# Patient Record
Sex: Male | Born: 1995 | Hispanic: Yes | Marital: Single | Smoking: Never smoker
Health system: Southern US, Community
[De-identification: ages and names within clinical notes are randomized; demographics above are authoritative.]

## PROBLEM LIST (undated history)

## (undated) DIAGNOSIS — Z6831 Body mass index (BMI) 31.0-31.9, adult: Secondary | ICD-10-CM

## (undated) HISTORY — DX: Body mass index (BMI) 31.0-31.9, adult: Z68.31

---

## 2016-03-29 ENCOUNTER — Encounter: Payer: Self-pay | Admitting: Emergency Medicine

## 2016-03-29 ENCOUNTER — Emergency Department
Admission: EM | Admit: 2016-03-29 | Discharge: 2016-03-29 | Disposition: A | Discharge status: Home / Self Care | Payer: BLUE CROSS/BLUE SHIELD | Attending: Student | Admitting: Student

## 2016-03-29 DIAGNOSIS — S61411A Laceration without foreign body of right hand, initial encounter: Secondary | ICD-10-CM | POA: Insufficient documentation

## 2016-03-29 DIAGNOSIS — Y939 Activity, unspecified: Secondary | ICD-10-CM | POA: Diagnosis not present

## 2016-03-29 DIAGNOSIS — W268XXA Contact with other sharp object(s), not elsewhere classified, initial encounter: Secondary | ICD-10-CM | POA: Diagnosis not present

## 2016-03-29 DIAGNOSIS — Y999 Unspecified external cause status: Secondary | ICD-10-CM | POA: Diagnosis not present

## 2016-03-29 DIAGNOSIS — Y929 Unspecified place or not applicable: Secondary | ICD-10-CM | POA: Diagnosis not present

## 2016-03-29 MED ORDER — TETANUS-DIPHTH-ACELL PERTUSSIS 5-2.5-18.5 LF-MCG/0.5 IM SUSP
0.5000 mL | Freq: Once | INTRAMUSCULAR | Status: AC
  Administered 2016-03-29: 0.5 mL via INTRAMUSCULAR
  Filled 2016-03-29: qty 0.5

## 2016-03-29 MED ORDER — TRAMADOL HCL 50 MG PO TABS
50.0000 mg | ORAL_TABLET | Freq: Four times a day (QID) | ORAL | Status: AC | PRN
Start: 2016-03-29 — End: 2017-03-29

## 2016-03-29 MED ORDER — LIDOCAINE HCL (PF) 1 % IJ SOLN
INTRAMUSCULAR | Status: AC
  Filled 2016-03-29: qty 5

## 2016-03-29 MED ORDER — TRAMADOL HCL 50 MG PO TABS
50.0000 mg | ORAL_TABLET | Freq: Once | ORAL | Status: AC
  Administered 2016-03-29: 50 mg via ORAL
  Filled 2016-03-29: qty 1

## 2016-03-29 MED ORDER — SULFAMETHOXAZOLE-TRIMETHOPRIM 800-160 MG PO TABS: 1.0000 | ORAL_TABLET | Freq: Two times a day (BID) | ORAL | Status: DC

## 2016-03-29 NOTE — ED Provider Notes (Signed)
[facility] Emergency Department Provider Note   ____________________________________________  Time seen: Approximately 6:29 PM  I have reviewed the triage vital signs and the nursing notes.   HISTORY  Chief Complaint Laceration    HPI Jesse Jimenez. is a 20 y.o. male patient is a laceration to back her right hand at the third and fourth metacarpal head. Patient is a metal cut from a goal posteach occurred prior to arrival. Patient state bleeding is controlled only with direct pressure. Patient denies any loss sensation or loss of function. Patient is right-hand dominant. No other palliative measures for this complaint. She denies pain at this time.   History reviewed. No pertinent past medical history.  There are no active problems to display for this patient.   History reviewed. No pertinent past surgical history.  Current Outpatient Rx  Name  Route  Sig  Dispense  Refill  . sulfamethoxazole-trimethoprim (BACTRIM DS,SEPTRA DS) 800-160 MG tablet   Oral   Take 1 tablet by mouth 2 (two) times daily.   20 tablet   0   . traMADol (ULTRAM) 50 MG tablet   Oral   Take 1 tablet (50 mg total) by mouth every 6 (six) hours as needed.   20 tablet   0     Allergies Review of patient's allergies indicates no known allergies.  History reviewed. No pertinent family history.  Social History Social History  Substance Use Topics  . Smoking status: Never Smoker   . Smokeless tobacco: None  . Alcohol Use: No    Review of Systems Constitutional: No fever/chills Eyes: No visual changes. ENT: No sore throat. Cardiovascular: Denies chest pain. Respiratory: Denies shortness of breath. Gastrointestinal: No abdominal pain.  No nausea, no vomiting.  No diarrhea.  No constipation. Genitourinary: Negative for dysuria. Musculoskeletal: Negative for back pain. Skin: Negative for rash. Laceration back right hand Neurological: Negative for headaches,  focal weakness or numbness.  .  ____________________________________________   PHYSICAL EXAM:  VITAL SIGNS: ED Triage Vitals  Enc Vitals Group     BP --      Pulse --      Resp --      Temp --      Temp src --      SpO2 --      Weight --      Height --      Head Cir --      Peak Flow --      Pain Score 03/29/16 1706 0     Pain Loc --      Pain Edu? --      Excl. in GC? --     Constitutional: Alert and oriented. Well appearing and in no acute distress. Eyes: Conjunctivae are normal. PERRL. EOMI. Head: Atraumatic. Nose: No congestion/rhinnorhea. Mouth/Throat: Mucous membranes are moist.  Oropharynx non-erythematous. Neck: No stridor. No cervical spine tenderness to palpation. Hematological/Lymphatic/Immunilogical: No cervical lymphadenopathy. Cardiovascular: Normal rate, regular rhythm. Grossly normal heart sounds.  Good peripheral circulation. Respiratory: Normal respiratory effort.  No retractions. Lungs CTAB. Gastrointestinal: Soft and nontender. No distention. No abdominal bruits. No CVA tenderness. Musculoskeletal: No lower extremity tenderness nor edema.  No joint effusions. Neurologic:  Normal speech and language. No gross focal neurologic deficits are appreciated. No gait instability. Skin:  Skin is warm, dry and intact. No rash noted.0.5 cm laceration at both the third and fourth metacarpal head. Psychiatric: Mood and affect are normal. Speech and behavior are normal.  ____________________________________________  LABS (all labs ordered are listed, but only abnormal results are displayed)  Labs Reviewed - No data to display ____________________________________________  EKG   ____________________________________________  RADIOLOGY   ____________________________________________   PROCEDURES  Procedure(s) performed: LACERATION REPAIR Performed by: Joni Reiningonald K Lucindia Lemley Authorized by: Joni Reiningonald K Jemila Camille Consent: Verbal consent obtained. Risks and benefits:  risks, benefits and alternatives were discussed Consent given by: patient Patient identity confirmed: provided demographic data Prepped and Draped in normal sterile fashion Wound explored  Laceration Location: Right third and fourth metacarpal head.  Laceration Length: Both laceration 0.5 cm  No Foreign Bodies seen or palpated  Anesthesia: local infiltration  Local anesthetic: lidocaine 1% without epinephrine  Anesthetic total: 4 ml  Irrigation method: syringe Amount of cleaning: standard  Skin closure: 4-0 nylon   Number of sutures: 4 sutures third metacarpal and 3 sutures fourth metacarpal   Technique: Simple   Patient tolerance: Patient tolerated the procedure well with no immediate complications.   Procedures  Critical Care performed: No  ____________________________________________   INITIAL IMPRESSION / ASSESSMENT AND PLAN / ED COURSE  Pertinent labs & imaging results that were available during my care of the patient were reviewed by me and considered in my medical decision making (see chart for details).  Right hand laceration involving the third and fourth metacarpal head. Patient given discharge care instruction. Patient given a prescription for tramadol and Bactrim DS. Patient given a tetanus shot in the ED before discharge. Patient advised return back 10 days for suture removal. Patient also has sutures removed by family urgent care clinic. ____________________________________________   FINAL CLINICAL IMPRESSION(S) / ED DIAGNOSES  Final diagnoses:  Hand laceration, right, initial encounter      NEW MEDICATIONS STARTED DURING THIS VISIT:  New Prescriptions   SULFAMETHOXAZOLE-TRIMETHOPRIM (BACTRIM DS,SEPTRA DS) 800-160 MG TABLET    Take 1 tablet by mouth 2 (two) times daily.   TRAMADOL (ULTRAM) 50 MG TABLET    Take 1 tablet (50 mg total) by mouth every 6 (six) hours as needed.     Note:  This document was prepared using Dragon voice recognition  software and may include unintentional dictation errors.    Joni Reiningonald K Elizabella Nolet, PA-C 03/29/16 1859  Gayla DossEryka A Gayle, MD 03/30/16 [phone]

## 2016-03-29 NOTE — ED Notes (Signed)
Pt to ed with laceration to right hand.  Pt states he cut it on a goal post today.  Bandage applied.  Pt report no pain at this time. No bleeding noted.

## 2016-03-29 NOTE — Discharge Instructions (Signed)
Laceration Care, Adult  A laceration is a cut that goes through all layers of the skin. The cut also goes into the tissue that is right under the skin. Some cuts heal on their own. Others need to be closed with stitches (sutures), staples, skin adhesive strips, or wound glue. Taking care of your cut lowers your risk of infection and helps your cut to heal better.  HOW TO TAKE CARE OF YOUR CUT  For stitches or staples:  · Keep the wound clean and dry.  · If you were given a bandage (dressing), you should change it at least one time per day or as told by your doctor. You should also change it if it gets wet or dirty.  · Keep the wound completely dry for the first 24 hours or as told by your doctor. After that time, you may take a shower or a bath. However, make sure that the wound is not soaked in water until after the stitches or staples have been removed.  · Clean the wound one time each day or as told by your doctor:    Wash the wound with soap and water.    Rinse the wound with water until all of the soap comes off.    Pat the wound dry with a clean towel. Do not rub the wound.  · After you clean the wound, put a thin layer of antibiotic ointment on it as told by your doctor. This ointment:    Helps to prevent infection.    Keeps the bandage from sticking to the wound.  · Have your stitches or staples removed as told by your doctor.  If your doctor used skin adhesive strips:   · Keep the wound clean and dry.  · If you were given a bandage, you should change it at least one time per day or as told by your doctor. You should also change it if it gets dirty or wet.  · Do not get the skin adhesive strips wet. You can take a shower or a bath, but be careful to keep the wound dry.  · If the wound gets wet, pat it dry with a clean towel. Do not rub the wound.  · Skin adhesive strips fall off on their own. You can trim the strips as the wound heals. Do not remove any strips that are still stuck to the wound. They will  fall off after a while.  If your doctor used wound glue:  · Try to keep your wound dry, but you may briefly wet it in the shower or bath. Do not soak the wound in water, such as by swimming.  · After you take a shower or a bath, gently pat the wound dry with a clean towel. Do not rub the wound.  · Do not do any activities that will make you really sweaty until the skin glue has fallen off on its own.  · Do not apply liquid, cream, or ointment medicine to your wound while the skin glue is still on.  · If you were given a bandage, you should change it at least one time per day or as told by your doctor. You should also change it if it gets dirty or wet.  · If a bandage is placed over the wound, do not let the tape for the bandage touch the skin glue.  · Do not pick at the glue. The skin glue usually stays on for 5-10 days. Then, it   falls off of the skin.  General Instructions   · To help prevent scarring, make sure to cover your wound with sunscreen whenever you are outside after stitches are removed, after adhesive strips are removed, or when wound glue stays in place and the wound is healed. Make sure to wear a sunscreen of at least 30 SPF.  · Take over-the-counter and prescription medicines only as told by your doctor.  · If you were given antibiotic medicine or ointment, take or apply it as told by your doctor. Do not stop using the antibiotic even if your wound is getting better.  · Do not scratch or pick at the wound.  · Keep all follow-up visits as told by your doctor. This is important.  · Check your wound every day for signs of infection. Watch for:    Redness, swelling, or pain.    Fluid, blood, or pus.  · Raise (elevate) the injured area above the level of your heart while you are sitting or lying down, if possible.  GET HELP IF:  · You got a tetanus shot and you have any of these problems at the injection site:    Swelling.    Very bad pain.    Redness.    Bleeding.  · You have a fever.  · A wound that was  closed breaks open.  · You notice a bad smell coming from your wound or your bandage.  · You notice something coming out of the wound, such as wood or glass.  · Medicine does not help your pain.  · You have more redness, swelling, or pain at the site of your wound.  · You have fluid, blood, or pus coming from your wound.  · You notice a change in the color of your skin near your wound.  · You need to change the bandage often because fluid, blood, or pus is coming from the wound.  · You start to have a new rash.  · You start to have numbness around the wound.  GET HELP RIGHT AWAY IF:  · You have very bad swelling around the wound.  · Your pain suddenly gets worse and is very bad.  · You notice painful lumps near the wound or on skin that is anywhere on your body.  · You have a red streak going away from your wound.  · The wound is on your hand or foot and you cannot move a finger or toe like you usually can.  · The wound is on your hand or foot and you notice that your fingers or toes look pale or bluish.     This information is not intended to replace advice given to you by your health care provider. Make sure you discuss any questions you have with your health care provider.     Document Released: 02/25/2008 Document Revised: 01/23/2015 Document Reviewed: 09/04/2014  Elsevier Interactive Patient Education ©2016 [employer]

## 2016-04-08 ENCOUNTER — Encounter: Payer: Self-pay | Admitting: Intensive Care

## 2016-04-08 ENCOUNTER — Emergency Department
Admission: EM | Admit: 2016-04-08 | Discharge: 2016-04-08 | Disposition: A | Discharge status: Home / Self Care | Payer: BLUE CROSS/BLUE SHIELD | Attending: Emergency Medicine | Admitting: Emergency Medicine

## 2016-04-08 DIAGNOSIS — Z4802 Encounter for removal of sutures: Secondary | ICD-10-CM | POA: Insufficient documentation

## 2016-04-08 NOTE — ED Notes (Signed)
Patient presents to ER for suture removal on R hand

## 2016-04-08 NOTE — ED Provider Notes (Signed)
[facility] Emergency Department Provider Note  Time seen: Approximately 12:12 PM  I have reviewed the triage vital signs and the nursing notes.   HISTORY  Chief Complaint No chief complaint on file.   HPI Jesse ClossWalter Helbig Jr. is a 20 y.o. male presents for the requested suture removal. Patient reports that he was seen in the ER 1 week ago and had laceration to right dorsal hand repaired. Patient states that time he was cut from a metal goalpost. Patient states that the wound appears to be healing well without any complications. States no pain area and reports full range of motion to his right hand. Denies any drainage, redness, fevers or pain. Reports doing well. Reports tetanus was updated at ER visit.   No past medical history on file.  There are no active problems to display for this patient.   No past surgical history on file.  Current Outpatient Rx  Name  Route  Sig  Dispense  Refill  .           Marland Kitchen.             Allergies Review of patient's allergies indicates no known allergies.  No family history on file.  Social History Social History  Substance Use Topics  . Smoking status: Never Smoker   . Smokeless tobacco: Not on file  . Alcohol Use: No    Review of Systems Constitutional: No fever/chills Eyes: No visual changes. ENT: No sore throat. Cardiovascular: Denies chest pain. Respiratory: Denies shortness of breath. Gastrointestinal: No abdominal pain.  No nausea, no vomiting.  No diarrhea.  No constipation. Genitourinary: Negative for dysuria. Musculoskeletal: Negative for back pain. Skin: Negative for rash. Neurological: Negative for headaches, focal weakness or numbness.  10-point ROS otherwise negative.  ____________________________________________   PHYSICAL EXAM:  VITAL SIGNS: ED Triage Vitals  Enc Vitals Group     BP 04/08/16 1205 116/57 mmHg     Pulse Rate 04/08/16 1205 69     Resp 04/08/16 1205 18     Temp  04/08/16 1205 98.6 F (37 C)     Temp src --      SpO2 04/08/16 1205 100 %     Weight 04/08/16 1205 200 lb (90.719 kg)     Height 04/08/16 1205 5\' 7"  (1.702 m)     Head Cir --      Peak Flow --      Pain Score 04/08/16 1206 0     Pain Loc --      Pain Edu? --      Excl. in GC? --     Constitutional: Alert and oriented. Well appearing and in no acute distress. Head: Atraumatic.  Nose: No congestion/rhinnorhea.  Mouth/Throat: Mucous membranes are moist.   Neck: No stridor.  No cervical spine tenderness to palpation. Cardiovascular: Normal rate, regular rhythm. Grossly normal heart sounds.  Good peripheral circulation. Respiratory: Normal respiratory effort.  No retractions. Lungs CTAB. No wheezes, rales rhonchi. Musculoskeletal: Right hand nontender, full range of motion, normal capillary refill. Neurologic:  Normal speech and language. No gross focal neurologic deficits are appreciated. No gait instability. Skin:  Skin is warm, dry and intact. No rash noted. Except: Right dorsal hand base of third and fourth metacarpal 3 sutures at each site with well approximated healing wounds, no exudates, no drainage, no surrounding erythema, no swelling, full range of motion, nontender. No signs of bacterial skin infection. Psychiatric: Mood and affect are normal. Speech and behavior  are normal.  ____________________________________________   LABS (all labs ordered are listed, but only abnormal results are displayed)  Labs Reviewed - No data to display   PROCEDURES  Procedure(s) performed: SUTURE REMOVAL Performed byRN Consent: Verbal consent obtained. Patient identity confirmed: provided demographic data Time out: Immediately prior to procedure a "time out" was called to verify the correct patient, procedure, equipment, support staff and site/side marked as required.  Location details: Right dorsal hand at base of third and fourth metacarpals 3 sutures each  Wound Appearance:  clean  Sutures/Staples Removed: 6 total   Facility: sutures placed in this facility Patient tolerance: Patient tolerated the procedure well with no immediate complications.    ____________________________________________   INITIAL IMPRESSION / ASSESSMENT AND PLAN / ED COURSE  Pertinent labs & imaging results that were available during my care of the patient were reviewed by me and considered in my medical decision making (see chart for details).  Well-appearing patient. Presents for suture removal. Right dorsal hand 6 total sutures present. Wound well approximated without signs of infection. Sutures removed by RN. Patient tolerated well. Discussed follow-up and return parameters as needed.  Discussed follow up with Primary care physician this week. Discussed follow up and return parameters including no resolution or any worsening concerns. Patient verbalized understanding and agreed to plan.   ____________________________________________   FINAL CLINICAL IMPRESSION(S) / ED DIAGNOSES  Final diagnoses:  Visit for suture removal     New Prescriptions   No medications on file    Note: This dictation was prepared with Dragon dictation along with smaller phrase technology. Any transcriptional errors that result from this process are unintentional.       Renford Dills, NP 04/08/16 1216  Rockne Menghini, MD 04/08/16 1526

## 2016-04-08 NOTE — Discharge Instructions (Signed)

## 2016-06-19 ENCOUNTER — Encounter: Payer: Self-pay | Admitting: Emergency Medicine

## 2016-06-19 ENCOUNTER — Emergency Department
Admission: EM | Admit: 2016-06-19 | Discharge: 2016-06-19 | Disposition: A | Discharge status: Home / Self Care | Payer: BLUE CROSS/BLUE SHIELD | Attending: Emergency Medicine | Admitting: Emergency Medicine

## 2016-06-19 DIAGNOSIS — Z79899 Other long term (current) drug therapy: Secondary | ICD-10-CM | POA: Diagnosis not present

## 2016-06-19 DIAGNOSIS — R112 Nausea with vomiting, unspecified: Secondary | ICD-10-CM | POA: Insufficient documentation

## 2016-06-19 MED ORDER — ONDANSETRON HCL 4 MG PO TABS: 4.0000 mg | ORAL_TABLET | Freq: Three times a day (TID) | ORAL | 0 refills | Status: DC | PRN

## 2016-06-19 MED ORDER — ONDANSETRON 4 MG PO TBDP
4.0000 mg | ORAL_TABLET | Freq: Once | ORAL | Status: AC
  Administered 2016-06-19: 4 mg via ORAL
  Filled 2016-06-19: qty 1

## 2016-06-19 NOTE — ED Notes (Addendum)
Pt presents to ED with c/o nausea/vomiting when he woke up to go to work. No prior illness leading to vomiting this morning. Denies diarrhea. Pt alerts and oriented x3 at this time, airway intact.

## 2016-06-19 NOTE — ED Notes (Signed)
[id number] and [id number] notes entered in error

## 2016-06-19 NOTE — ED Triage Notes (Signed)
Patient ambulatory to triage with steady gait, without difficulty or distress noted; pt reports upon waking up to go to work vomited x2

## 2016-06-19 NOTE — ED Provider Notes (Signed)
[facility] Emergency Department Provider Note  ____________________________________________   I have reviewed the triage vital signs and the nursing notes.   HISTORY  Chief Complaint Emesis    HPI Jesse ClossWalter Nhem Jr. is a 20 y.o. male who presents today after having vomited twice this morning when he woke up. Denies any diarrhea. No fever. Not having any abdominal pain. Nonbloody nonbilious vomit. Was normal yesterday. No recent camping no sick contacts, he would like a work note.  History reviewed. No pertinent past medical history.  There are no active problems to display for this patient.   History reviewed. No pertinent surgical history.  Prior to Admission medications   Medication Sig Start Date End Date Taking? Authorizing Provider  sulfamethoxazole-trimethoprim (BACTRIM DS,SEPTRA DS) 800-160 MG tablet Take 1 tablet by mouth 2 (two) times daily. 03/29/16   Joni Reiningonald K Smith, PA-C  traMADol (ULTRAM) 50 MG tablet Take 1 tablet (50 mg total) by mouth every 6 (six) hours as needed. 03/29/16 03/29/17  Joni Reiningonald K Smith, PA-C    Allergies Review of patient's allergies indicates no known allergies.  No family history on file.  Social History Social History  Substance Use Topics  . Smoking status: Never Smoker  . Smokeless tobacco: Never Used  . Alcohol use Yes     Comment: Occ    Review of Systems Constitutional: No fever/chills Eyes: No visual changes. ENT: No sore throat. No stiff neck no neck pain Cardiovascular: Denies chest pain. Respiratory: Denies shortness of breath. Gastrointestinal:   Positive vomiting.  No diarrhea.  No constipation. Genitourinary: Negative for dysuria. Musculoskeletal: Negative lower extremity swelling Skin: Negative for rash. Neurological: Negative for severe headaches, focal weakness or numbness. 10-point ROS otherwise negative.  ____________________________________________   PHYSICAL EXAM:  VITAL SIGNS: ED  Triage Vitals  Enc Vitals Group     BP 06/19/16 0536 118/83     Pulse Rate 06/19/16 0536 68     Resp 06/19/16 0536 18     Temp 06/19/16 0536 97.4 F (36.3 C)     Temp Source 06/19/16 0536 Oral     SpO2 06/19/16 0536 98 %     Weight 06/19/16 0535 215 lb (97.5 kg)     Height 06/19/16 0535 5\' 7"  (1.702 m)     Head Circumference --      Peak Flow --      Pain Score 06/19/16 0535 5     Pain Loc --      Pain Edu? --      Excl. in GC? --     Constitutional: Alert and oriented. Well appearing and in no acute distress. Eyes: Conjunctivae are normal. PERRL. EOMI. Head: Atraumatic. Nose: No congestion/rhinnorhea. Mouth/Throat: Mucous membranes are moist.  Oropharynx non-erythematous. Neck: No stridor.   Nontender with no meningismus Cardiovascular: Normal rate, regular rhythm. Grossly normal heart sounds.  Good peripheral circulation. Respiratory: Normal respiratory effort.  No retractions. Lungs CTAB. Abdominal: Soft and nontender. No distention. No guarding no rebound Back:  There is no focal tenderness or step off.  there is no midline tenderness there are no lesions noted. there is no CVA tenderness Musculoskeletal: No lower extremity tenderness, no upper extremity tenderness. No joint effusions, no DVT signs strong distal pulses no edema Neurologic:  Normal speech and language. No gross focal neurologic deficits are appreciated.  Skin:  Skin is warm, dry and intact. No rash noted. Psychiatric: Mood and affect are normal. Speech and behavior are normal.  ____________________________________________   LABS (  all labs ordered are listed, but only abnormal results are displayed)  Labs Reviewed - No data to display ____________________________________________  EKG  I personally interpreted any EKGs ordered by me or triage  ____________________________________________  RADIOLOGY  I reviewed any imaging ordered by me or triage that were performed during my shift and, if possible,  patient and/or family made aware of any abnormal findings. ____________________________________________   PROCEDURES  Procedure(s) performed: None  Procedures  Critical Care performed: None  ____________________________________________   INITIAL IMPRESSION / ASSESSMENT AND PLAN / ED COURSE  Pertinent labs & imaging results that were available during my care of the patient were reviewed by me and considered in my medical decision making (see chart for details).  Very well-appearing young man who had 2 episodes of emesis this morning with a normal abdominal exam. This time, blood work is not indicated. Orders imaging. Extensive return precautions for worsening symptoms including abdominal pain, focal abdominal pain, persistent vomiting or anything else of concern has been explained to him. Patient will return to the emergency room if he feels worse. Return precautions and follow-up given and understood.  Clinical Course   ____________________________________________   FINAL CLINICAL IMPRESSION(S) / ED DIAGNOSES  Final diagnoses:  None      This chart was dictated using voice recognition software.  Despite best efforts to proofread,  errors can occur which can change meaning.      Jeanmarie Plant, MD 06/19/16 [phone]

## 2019-07-07 ENCOUNTER — Other Ambulatory Visit: Payer: Self-pay

## 2019-07-07 DIAGNOSIS — Z20822 Contact with and (suspected) exposure to covid-19: Secondary | ICD-10-CM

## 2019-07-09 LAB — NOVEL CORONAVIRUS, NAA: SARS-CoV-2, NAA: NOT DETECTED

## 2019-10-10 ENCOUNTER — Other Ambulatory Visit: Payer: Self-pay

## 2019-10-10 ENCOUNTER — Emergency Department
Admission: EM | Admit: 2019-10-10 | Discharge: 2019-10-10 | Disposition: A | Discharge status: Home / Self Care | Payer: Self-pay | Attending: Emergency Medicine | Admitting: Emergency Medicine

## 2019-10-10 DIAGNOSIS — L03221 Cellulitis of neck: Secondary | ICD-10-CM | POA: Insufficient documentation

## 2019-10-10 MED ORDER — CEPHALEXIN 500 MG PO CAPS: 500.0000 mg | ORAL_CAPSULE | Freq: Three times a day (TID) | ORAL | 0 refills | Status: DC

## 2019-10-10 MED ORDER — CEPHALEXIN 500 MG PO CAPS
500.0000 mg | ORAL_CAPSULE | Freq: Once | ORAL | Status: AC
Start: 2019-10-10 — End: 2019-10-10
  Administered 2019-10-10: 500 mg via ORAL
  Filled 2019-10-10: qty 1

## 2019-10-10 NOTE — ED Provider Notes (Signed)
[facility] Emergency Department Provider Note  Time seen: 12:39 AM  I have reviewed the triage vital signs and the nursing notes.   HISTORY  Chief Complaint Neck Pain (Possible spider bite)   HPI Jesse Saccente. is a 24 y.o. male with no past medical history presents to the emergency department for a painful area to the back of his neck.  According to the patient earlier today he noted a tender and raised area to the back of the neck.  Denies any known injuries such as insect bite.  No other lesions on his body.  Denies any fever.  Negative review of systems including cough congestion or shortness of breath.   History reviewed. No pertinent past medical history.  There are no problems to display for this patient.   History reviewed. No pertinent surgical history.  Prior to Admission medications   Medication Sig Start Date End Date Taking? Authorizing Provider  cephALEXin (KEFLEX) 500 MG capsule Take 1 capsule (500 mg total) by mouth 3 (three) times daily. 10/10/19   Minna Antis, MD  ondansetron (ZOFRAN) 4 MG tablet Take 1 tablet (4 mg total) by mouth every 8 (eight) hours as needed for nausea or vomiting. 06/19/16   Jeanmarie Plant, MD  sulfamethoxazole-trimethoprim (BACTRIM DS,SEPTRA DS) 800-160 MG tablet Take 1 tablet by mouth 2 (two) times daily. 03/29/16   Joni Reining, PA-C    No Known Allergies  No family history on file.  Social History Social History   Tobacco Use  . Smoking status: Never Smoker  . Smokeless tobacco: Never Used  Substance Use Topics  . Alcohol use: Yes    Comment: Occ  . Drug use: No    Review of Systems Constitutional: Negative for fever. Cardiovascular: Negative for chest pain. Respiratory: Negative for shortness of breath. Gastrointestinal: Negative for abdominal pain Skin: Tender area to back of neck. Neurological: Negative for headache All other ROS  negative  ____________________________________________   PHYSICAL EXAM:  VITAL SIGNS: ED Triage Vitals  Enc Vitals Group     BP 10/10/19 0018 126/85     Pulse Rate 10/10/19 0018 (!) 58     Resp 10/10/19 0018 18     Temp 10/10/19 0018 97.9 F (36.6 C)     Temp Source 10/10/19 0018 Oral     SpO2 10/10/19 0018 98 %     Weight 10/10/19 0019 210 lb (95.3 kg)     Height 10/10/19 0019 5\' 7"  (1.702 m)     Head Circumference --      Peak Flow --      Pain Score 10/10/19 0018 0     Pain Loc --      Pain Edu? --      Excl. in [place]? --    Constitutional: Alert and oriented. Well appearing and in no distress. Eyes: Normal exam ENT      Head: Normocephalic and atraumatic.  Patient has a 2 x 2 centimeter indurated mildly erythematous area to the back of the neck, no pointing, no sign of abscess, most consistent with a mild cellulitis.      Mouth/Throat: Mucous membranes are moist. Cardiovascular: Normal rate, regular rhythm. No murmurs, rubs, or gallops. Respiratory: Normal respiratory effort without tachypnea nor retractions. Breath sounds are clear  Gastrointestinal: Soft and nontender. No distention.   Musculoskeletal: Nontender with normal range of motion in all extremities.  Neurologic:  Normal speech and language. No gross focal neurologic deficits  Skin: 2 x  2 centimeter indurated and erythematous area to the back of the neck. Psychiatric: Mood and affect are normal.   ____________________________________________  INITIAL IMPRESSION / ASSESSMENT AND PLAN / ED COURSE  Pertinent labs & imaging results that were available during my care of the patient were reviewed by me and considered in my medical decision making (see chart for details).   Patient presents to the emergency department for a painful tender area to the back of the neck.  Mild tenderness on my exam.  2 x 2 centimeter area of induration/tenderness with mild erythema.  No pointing no sign of abscess.  Most consistent with  an early mild cellulitis.  We will place the patient on antibiotics.  I discussed return precautions for any worsening of the infection spread or fever.  Jesse Jimenez. was evaluated in Emergency Department on 10/10/2019 for the symptoms described in the history of present illness. He was evaluated in the context of the global COVID-19 pandemic, which necessitated consideration that the patient might be at risk for infection with the SARS-CoV-2 virus that causes COVID-19. Institutional protocols and algorithms that pertain to the evaluation of patients at risk for COVID-19 are in a state of rapid change based on information released by regulatory bodies including the CDC and federal and state organizations. These policies and algorithms were followed during the patient's care in the ED.  ____________________________________________   FINAL CLINICAL IMPRESSION(S) / ED DIAGNOSES  Cellulitis   Harvest Dark, MD 10/10/19 0041

## 2019-10-10 NOTE — ED Triage Notes (Signed)
Patient noticed some neck pain this morning that has progressively gotten worse over the day. Tonight he noticed a raised area on the back of his neck that is painful to touch. States that it burns. Area is raised without central punctate area. No oozing noted.

## 2019-10-10 NOTE — ED Notes (Signed)
Pt has swollen area on back of neck for 1 day.  No drainage.  Pt reports squeezing the area, but nothing came out.  Pt alert. Pt in hallway bed.

## 2019-10-18 ENCOUNTER — Emergency Department: Payer: Self-pay

## 2019-10-18 ENCOUNTER — Other Ambulatory Visit: Payer: Self-pay

## 2019-10-18 ENCOUNTER — Encounter: Payer: Self-pay | Admitting: Emergency Medicine

## 2019-10-18 ENCOUNTER — Emergency Department
Admission: EM | Admit: 2019-10-18 | Discharge: 2019-10-18 | Disposition: A | Discharge status: Home / Self Care | Payer: Self-pay | Attending: Emergency Medicine | Admitting: Emergency Medicine

## 2019-10-18 DIAGNOSIS — R071 Chest pain on breathing: Secondary | ICD-10-CM | POA: Insufficient documentation

## 2019-10-18 DIAGNOSIS — M940 Chondrocostal junction syndrome [Tietze]: Secondary | ICD-10-CM

## 2019-10-18 DIAGNOSIS — R0789 Other chest pain: Secondary | ICD-10-CM

## 2019-10-18 LAB — CBC
HCT: 44.4 % (ref 39.0–52.0)
Hemoglobin: 14.4 g/dL (ref 13.0–17.0)
MCH: 27.2 pg (ref 26.0–34.0)
MCHC: 32.4 g/dL (ref 30.0–36.0)
MCV: 83.8 fL (ref 80.0–100.0)
Platelets: 157 10*3/uL (ref 150–400)
RBC: 5.3 MIL/uL (ref 4.22–5.81)
RDW: 13.2 % (ref 11.5–15.5)
WBC: 6.8 10*3/uL (ref 4.0–10.5)
nRBC: 0 % (ref 0.0–0.2)

## 2019-10-18 LAB — BASIC METABOLIC PANEL
Anion gap: 7 (ref 5–15)
BUN: 17 mg/dL (ref 6–20)
CO2: 30 mmol/L (ref 22–32)
Calcium: 9.1 mg/dL (ref 8.9–10.3)
Chloride: 102 mmol/L (ref 98–111)
Creatinine, Ser: 0.87 mg/dL (ref 0.61–1.24)
GFR calc Af Amer: >60 mL/min (ref >60)
GFR calc non Af Amer: >60 mL/min (ref >60)
Glucose, Bld: 113 mg/dL — ABNORMAL HIGH (ref 70–99)
Potassium: 3.7 mmol/L (ref 3.5–5.1)
Sodium: 139 mmol/L (ref 135–145)

## 2019-10-18 LAB — TROPONIN I (HIGH SENSITIVITY): Troponin I (High Sensitivity): 2 ng/L (ref <18)

## 2019-10-18 MED ORDER — SODIUM CHLORIDE 0.9% FLUSH: 3.0000 mL | Freq: Once | INTRAVENOUS | Status: DC

## 2019-10-18 MED ORDER — CYCLOBENZAPRINE HCL 10 MG PO TABS: 10.0000 mg | ORAL_TABLET | Freq: Three times a day (TID) | ORAL | 0 refills | Status: DC | PRN

## 2019-10-18 MED ORDER — KETOROLAC TROMETHAMINE 30 MG/ML IJ SOLN
15.0000 mg | Freq: Once | INTRAMUSCULAR | Status: AC
  Administered 2019-10-18: 15 mg via INTRAVENOUS
  Filled 2019-10-18: qty 1

## 2019-10-18 MED ORDER — NAPROXEN 375 MG PO TABS: 375.0000 mg | ORAL_TABLET | Freq: Two times a day (BID) | ORAL | 0 refills | Status: AC

## 2019-10-18 NOTE — ED Provider Notes (Signed)
[facility] Emergency Department Provider Note  ____________________________________________   First MD Initiated Contact with Patient 10/18/19 0802     (approximate)  I have reviewed the triage vital signs and the nursing notes.   HISTORY  Chief Complaint Chest Pain    HPI Jesse Jimenez. is a 24 y.o. male  Here with anterior chest pain. Pain started yesterday as moderate to severe, sharp, anterior chest wall pain while at work. 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 Describes pain as a sharp, stabbing pain that is worse w/ deep inspiration, some movement and lifting. Took APAP with minimal relief. No cough. No SOB. No hemoptysis. No leg swelling, recent immobilization. Father had heart surgery last year but otherwise no significant FHx of early CAD, DVT, PE. No other complaints.  No recent infections or COVID exposures. NO fever.       History reviewed. No pertinent past medical history.  There are no problems to display for this patient.   History reviewed. No pertinent surgical history.  Prior to Admission medications   Medication Sig Start Date End Date Taking? Authorizing Provider  cephALEXin (KEFLEX) 500 MG capsule Take 1 capsule (500 mg total) by mouth 3 (three) times daily. 10/10/19   Minna Antis, MD  cyclobenzaprine (FLEXERIL) 10 MG tablet Take 1 tablet (10 mg total) by mouth 3 (three) times daily as needed for muscle spasms. 10/18/19   Shaune Pollack, MD  naproxen (NAPROSYN) 375 MG tablet Take 1 tablet (375 mg total) by mouth 2 (two) times daily with a meal for 7 days. 10/18/19 10/25/19  Shaune Pollack, MD  ondansetron (ZOFRAN) 4 MG tablet Take 1 tablet (4 mg total) by mouth every 8 (eight) hours as needed for nausea or vomiting. 06/19/16   Jeanmarie Plant, MD  sulfamethoxazole-trimethoprim (BACTRIM DS,SEPTRA DS) 800-160 MG tablet Take 1 tablet by mouth 2 (two) times daily. 03/29/16   Joni Reining, PA-C     Allergies Patient has no known allergies.  No family history on file.  Social History Social History   Tobacco Use  . Smoking status: Never Smoker  . Smokeless tobacco: Never Used  Substance Use Topics  . Alcohol use: Yes    Comment: Occ  . Drug use: No    Review of Systems  Review of Systems  Constitutional: Negative for chills, fatigue and fever.  HENT: Negative for sore throat.   Respiratory: Positive for chest tightness. Negative for shortness of breath.   Cardiovascular: Positive for chest pain.  Gastrointestinal: Negative for abdominal pain.  Genitourinary: Negative for flank pain.  Musculoskeletal: Negative for neck pain.  Skin: Negative for rash and wound.  Allergic/Immunologic: Negative for immunocompromised state.  Neurological: Negative for weakness and numbness.  Hematological: Does not bruise/bleed easily.  All other systems reviewed and are negative.    ____________________________________________  PHYSICAL EXAM:      VITAL SIGNS: ED Triage Vitals  Enc Vitals Group     BP 10/18/19 0800 120/64     Pulse Rate 10/18/19 0800 62     Resp 10/18/19 0800 16     Temp 10/18/19 0800 97.9 F (36.6 C)     Temp Source 10/18/19 0800 Oral     SpO2 10/18/19 0800 100 %     Weight 10/18/19 0757 210 lb (95.3 kg)     Height 10/18/19 0757 5\' 6"  (1.676 m)     Head Circumference --      Peak Flow --  Pain Score 10/18/19 0757 5     Pain Loc --      Pain Edu? --      Excl. in [place]? --      Physical Exam Vitals and nursing note reviewed.  Constitutional:      General: He is not in acute distress.    Appearance: He is well-developed.  HENT:     Head: Normocephalic and atraumatic.  Eyes:     Conjunctiva/sclera: Conjunctivae normal.  Cardiovascular:     Rate and Rhythm: Normal rate and regular rhythm.     Heart sounds: Normal heart sounds.  Pulmonary:     Effort: Pulmonary effort is normal. No respiratory distress.     Breath sounds: No wheezing.   Chest:     Comments: Minimal TTP over anterior 3-4th intercostal spaces b/l. No swelling. No skin lesions. Abdominal:     General: There is no distension.  Musculoskeletal:     Cervical back: Neck supple.  Skin:    General: Skin is warm.     Capillary Refill: Capillary refill takes less than 2 seconds.     Findings: No rash.  Neurological:     Mental Status: He is alert and oriented to person, place, and time.     Motor: No abnormal muscle tone.       ____________________________________________   LABS (all labs ordered are listed, but only abnormal results are displayed)  Labs Reviewed  BASIC METABOLIC PANEL - Abnormal; Notable for the following components:      Result Value   Glucose, Bld 113 (*)    All other components within normal limits  CBC  TROPONIN I (HIGH SENSITIVITY)  TROPONIN I (HIGH SENSITIVITY)    ____________________________________________  EKG: Normal sinus rhythm, VR 74. PR 146, QRS 90, QTc 410. Normal sinus rhythm. Normal ECG. No acute ST elevations or depressions. ________________________________________  RADIOLOGY All imaging, including plain films, CT scans, and ultrasounds, independently reviewed by me, and interpretations confirmed via formal radiology reads.  ED MD interpretation:   CXR: Clear, no PNA or PTX  Official radiology report(s): DG Chest 2 View  Result Date: 10/18/2019 CLINICAL DATA:  Onset chest pain with deep inspiration yesterday. EXAM: CHEST - 2 VIEW COMPARISON:  None. FINDINGS: Lungs clear. Heart size normal. No pneumothorax or pleural fluid. No acute or focal bony abnormality. IMPRESSION: Negative chest. Electronically Signed   By: [name] M.D.   On: 10/18/2019 08:37    ____________________________________________  PROCEDURES   Procedure(s) performed (including Critical Care):  Procedures  ____________________________________________  INITIAL IMPRESSION / MDM / [place] / ED COURSE  As part of  my medical decision making, I reviewed the following data within the [place] notes reviewed and incorporated, Old chart reviewed, Notes from prior ED visits, and [place] Controlled Substance [place]. was evaluated in Emergency Department on 10/18/2019 for the symptoms described in the history of present illness. He was evaluated in the context of the global COVID-19 pandemic, which necessitated consideration that the patient might be at risk for infection with the SARS-CoV-2 virus that causes COVID-19. Institutional protocols and algorithms that pertain to the evaluation of patients at risk for COVID-19 are in a state of rapid change based on information released by regulatory bodies including the CDC and federal and state organizations. These policies and algorithms were followed during the patient's care in the ED.  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 CXR clear without PNA, PTX, cardiomegaly. Pain is not c/w dissection. He is not tachycardic, tachypneic, hypoxic and is PERC neg with no signs of DVT on exam - doubt PE. He is otherwise HDS. EKG nonischemic and trop neg despite constant sx >12 hours with HEART score <3 - doubt ACS. Will treat with NSAIDs, outpt follow-up as needed.  ____________________________________________  FINAL CLINICAL IMPRESSION(S) / ED DIAGNOSES  Final diagnoses:  Chest wall pain  Costochondritis     MEDICATIONS GIVEN DURING THIS VISIT:  Medications  sodium chloride flush (NS) 0.9 % injection 3 mL (3 mLs Intravenous Not Given 10/18/19 0808)  ketorolac (TORADOL) 30 MG/ML injection 15 mg (15 mg Intravenous Given 10/18/19 6811)     ED Discharge Orders         Ordered    naproxen (NAPROSYN) 375 MG tablet  2 times daily with meals      10/18/19 0816    cyclobenzaprine (FLEXERIL) 10 MG tablet  3 times daily PRN     10/18/19 0816           Note:  This document was prepared using Dragon voice recognition software and may include unintentional dictation errors.   Shaune Pollack, MD 10/18/19 [phone]

## 2019-10-18 NOTE — Discharge Instructions (Signed)
Try to keep an eye on what you are doing at work. Repetitive lifting can lead to your chest pain condition.  Take the Naproxen with food, as prescribed, for a week. Take this even if the pain is not severe. It will help with inflammation.  Avoid heavy lifting >15 lb for the next week.

## 2019-10-18 NOTE — ED Triage Notes (Signed)
Pt here with intermittent midsternal chest pain with deep inhalation that began yesterday while he was at work. Denies cough or hx of known covid. Does have occasional shob. NAD.

## 2019-11-30 ENCOUNTER — Emergency Department: Payer: Self-pay

## 2019-11-30 ENCOUNTER — Other Ambulatory Visit: Payer: Self-pay

## 2019-11-30 ENCOUNTER — Emergency Department
Admission: EM | Admit: 2019-11-30 | Discharge: 2019-11-30 | Disposition: A | Discharge status: Home / Self Care | Payer: Self-pay | Attending: Emergency Medicine | Admitting: Emergency Medicine

## 2019-11-30 ENCOUNTER — Encounter: Payer: Self-pay | Admitting: Emergency Medicine

## 2019-11-30 DIAGNOSIS — M898X1 Other specified disorders of bone, shoulder: Secondary | ICD-10-CM | POA: Insufficient documentation

## 2019-11-30 MED ORDER — NAPROXEN 500 MG PO TABS: 500.0000 mg | ORAL_TABLET | Freq: Two times a day (BID) | ORAL | 0 refills | Status: AC

## 2019-11-30 MED ORDER — KETOROLAC TROMETHAMINE 30 MG/ML IJ SOLN
30.0000 mg | Freq: Once | INTRAMUSCULAR | Status: AC
  Administered 2019-11-30: 30 mg via INTRAMUSCULAR
  Filled 2019-11-30: qty 1
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
## 2019-11-30 NOTE — Discharge Instructions (Signed)
Follow-up with your primary care provider or can no clinic acute care if any continued problems.  You may use ice to your shoulder and scapula as needed for pain.  Begin taking naproxen 500 mg twice daily with food.
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
## 2020-06-29 ENCOUNTER — Encounter: Payer: Self-pay | Admitting: Emergency Medicine

## 2020-06-29 ENCOUNTER — Other Ambulatory Visit: Payer: Self-pay

## 2020-06-29 ENCOUNTER — Emergency Department
Admission: EM | Admit: 2020-06-29 | Discharge: 2020-06-29 | Disposition: A | Discharge status: Home / Self Care | Payer: HRSA Program | Attending: Emergency Medicine | Admitting: Emergency Medicine

## 2020-06-29 ENCOUNTER — Emergency Department: Payer: HRSA Program

## 2020-06-29 DIAGNOSIS — U071 COVID-19: Secondary | ICD-10-CM | POA: Diagnosis not present

## 2020-06-29 DIAGNOSIS — Z20822 Contact with and (suspected) exposure to covid-19: Secondary | ICD-10-CM

## 2020-06-29 DIAGNOSIS — R059 Cough, unspecified: Secondary | ICD-10-CM | POA: Diagnosis present

## 2020-06-29 LAB — RESPIRATORY PANEL BY RT PCR (FLU A&B, COVID)
Influenza A by PCR: NEGATIVE
Influenza B by PCR: NEGATIVE
SARS Coronavirus 2 by RT PCR: POSITIVE — AB

## 2020-06-29 NOTE — ED Triage Notes (Signed)
Pt to ED via POV c/o cough and headache x 3 days. Pt states that his son was sick last week. Pt denies fever.

## 2020-06-29 NOTE — ED Provider Notes (Signed)
[facility] Emergency Department Provider Note  ____________________________________________   First MD Initiated Contact with Patient 06/29/20 1456     (approximate)  I have reviewed the triage vital signs and the nursing notes.   HISTORY  Chief Complaint Cough and Headache    HPI Jesse Jimenez. is a 24 y.o. male presents emergency department with Covid-like symptoms.  States his son was sick all last week and then his girlfriend tested positive today.  States he has had a cough and headache for 3 days.  No difficulty breathing at this time.  No vomiting or diarrhea.  Patient is not vaccinated for Covid    History reviewed. No pertinent past medical history.  There are no problems to display for this patient.   History reviewed. No pertinent surgical history.  Prior to Admission medications   Medication Sig Start Date End Date Taking? Authorizing Provider  naproxen (NAPROSYN) 500 MG tablet Take 1 tablet (500 mg total) by mouth 2 (two) times daily with a meal. 11/30/19   Tommi Rumps, PA-C    Allergies Patient has no known allergies.  No family history on file.  Social History Social History   Tobacco Use  . Smoking status: Never Smoker  . Smokeless tobacco: Never Used  Substance Use Topics  . Alcohol use: Yes    Comment: Occ  . Drug use: No    Review of Systems  Constitutional: No fever/chills Eyes: No visual changes. ENT: No sore throat. Respiratory: Positive cough Cardiovascular: Denies chest pain Gastrointestinal: Denies abdominal pain Genitourinary: Negative for dysuria. Musculoskeletal: Negative for back pain. Skin: Negative for rash. Psychiatric: no mood changes,     ____________________________________________   PHYSICAL EXAM:  VITAL SIGNS: ED Triage Vitals [06/29/20 1328]  Enc Vitals Group     BP 135/81     Pulse Rate 64     Resp 16     Temp 98.4 F (36.9 C)     Temp src      SpO2 100 %      Weight 200 lb (90.7 kg)     Height 5\' 7"  (1.702 m)     Head Circumference      Peak Flow      Pain Score 0     Pain Loc      Pain Edu?      Excl. in GC?     Constitutional: Alert and oriented. Well appearing and in no acute distress. Eyes: Conjunctivae are normal.  Head: Atraumatic. Nose: No congestion/rhinnorhea. Mouth/Throat: Mucous membranes are moist.   Neck:  supple no lymphadenopathy noted Cardiovascular: Normal rate, regular rhythm. Heart sounds are normal Respiratory: Normal respiratory effort.  No retractions, lungs c t a  Abd: soft nontender bs normal all 4 quad GU: deferred Musculoskeletal: FROM all extremities, warm and well perfused Neurologic:  Normal speech and language.  Skin:  Skin is warm, dry and intact. No rash noted. Psychiatric: Mood and affect are normal. Speech and behavior are normal.  ____________________________________________   LABS (all labs ordered are listed, but only abnormal results are displayed)  Labs Reviewed  RESPIRATORY PANEL BY RT PCR (FLU A&B, COVID) - Abnormal; Notable for the following components:      Result Value   SARS Coronavirus 2 by RT PCR POSITIVE (*)    All other components within normal limits   ____________________________________________   ____________________________________________  RADIOLOGY  Chest x-ray appears normal per the radiologist,  ____________________________________________   PROCEDURES  Procedure(s) performed:  No  Procedures    ____________________________________________   INITIAL IMPRESSION / ASSESSMENT AND PLAN / ED COURSE  Pertinent labs & imaging results that were available during my care of the patient were reviewed by me and considered in my medical decision making (see chart for details).   Patient is a 24 year old male presents emergency department with Covid-like symptoms.  See HPI.  Physical exam shows patient to appear well.  Chest x-ray is normal.  Covid test is  positive  I did explain the findings to the patient.  I did call him with his Covid results.  He is to follow-up with his regular doctor as needed.  Return emergency department worsening.  Also gave him the phone number to the [facility] infusion [facility].  If he feels that a fusion would benefit him he could call them for an appointment.  He was instructed to take over-the-counter Mucinex, zinc, and vitamin C.  Tylenol for fever as needed.  Drink plenty of fluids.  He was discharged in stable condition     Kishan Wachsmuth. was evaluated in Emergency Department on 06/29/2020 for the symptoms described in the history of present illness. He was evaluated in the context of the global COVID-19 pandemic, which necessitated consideration that the patient might be at risk for infection with the SARS-CoV-2 virus that causes COVID-19. Institutional protocols and algorithms that pertain to the evaluation of patients at risk for COVID-19 are in a state of rapid change based on information released by regulatory bodies including the CDC and federal and state organizations. These policies and algorithms were followed during the patient's care in the ED.    As part of my medical decision making, I reviewed the following data within the electronic MEDICAL RECORD NUMBER Nursing notes reviewed and incorporated, Labs reviewed , Old chart reviewed, Radiograph reviewed , Notes from prior ED visits and [employer] Controlled Substance Database  ____________________________________________   FINAL CLINICAL IMPRESSION(S) / ED DIAGNOSES  Final diagnoses:  Suspected COVID-19 virus infection  COVID-19      NEW MEDICATIONS STARTED DURING THIS VISIT:  Discharge Medication List as of 06/29/2020  4:17 PM       Note:  This document was prepared using Dragon voice recognition software and may include unintentional dictation errors.    Faythe Ghee, PA-C 06/29/20 1729    Minna Antis, MD 06/29/20 2236

## 2020-06-29 NOTE — Discharge Instructions (Signed)
If your test is positive follow-up with the infusion center as listed above. If worsening please return emergency department immediately Take over-the-counter Mucinex, zinc, and vitamin C.

## 2020-06-29 NOTE — ED Notes (Signed)
Pt states that he had a covid test this morning but does not have results back yet

## 2020-06-30 ENCOUNTER — Telehealth (HOSPITAL_COMMUNITY): Payer: Self-pay | Admitting: Emergency Medicine

## 2020-06-30 ENCOUNTER — Telehealth (HOSPITAL_COMMUNITY): Payer: Self-pay | Admitting: Adult Health

## 2020-06-30 NOTE — Telephone Encounter (Signed)
Called pt and explained possible mAb treatment. Sx started 06/26/20. Tested positive 06/29/20. Sx severe cough, fatigue, sore throat, sweating, loss of taste and smell. Denies past medical history. He said his girlfriend feels worse than him, and she is interesting in tx. Pt has a 25 year old at home who tested positive and doing well. Pt interested in tx. I informed him an APP will call back to schedule an appointment.

## 2020-06-30 NOTE — Telephone Encounter (Signed)
Called patient about treatment for COVID 19.  He did not answer, and I could not leave a voice mail.    Lillard Anes, NP

## 2020-07-01 ENCOUNTER — Encounter: Payer: Self-pay | Admitting: Physician Assistant

## 2020-07-01 ENCOUNTER — Telehealth: Payer: Self-pay | Admitting: Physician Assistant

## 2020-07-01 DIAGNOSIS — Z6831 Body mass index (BMI) 31.0-31.9, adult: Secondary | ICD-10-CM | POA: Insufficient documentation

## 2020-07-01 NOTE — Telephone Encounter (Signed)
  Called to discuss with patient about Covid symptoms and the use of casirivimab/imdevimab, a monoclonal antibody infusion for those with mild to moderate Covid symptoms and at a high risk of hospitalization.     No voice mail. Sent my chart message.   Manson Passey, PA - C

## 2020-07-02 ENCOUNTER — Emergency Department (HOSPITAL_COMMUNITY)
Admission: EM | Admit: 2020-07-02 | Discharge: 2020-07-02 | Discharge status: Against Medical Advice | Payer: Medicaid Other

## 2020-07-02 ENCOUNTER — Other Ambulatory Visit: Payer: Self-pay

## 2020-07-11 ENCOUNTER — Other Ambulatory Visit: Payer: Self-pay

## 2020-07-11 ENCOUNTER — Encounter: Payer: Self-pay | Admitting: Emergency Medicine

## 2020-07-11 ENCOUNTER — Emergency Department
Admission: EM | Admit: 2020-07-11 | Discharge: 2020-07-11 | Disposition: A | Discharge status: Home / Self Care | Payer: HRSA Program | Attending: Emergency Medicine | Admitting: Emergency Medicine

## 2020-07-11 ENCOUNTER — Emergency Department: Payer: HRSA Program

## 2020-07-11 DIAGNOSIS — U071 COVID-19: Secondary | ICD-10-CM | POA: Diagnosis not present

## 2020-07-11 DIAGNOSIS — R059 Cough, unspecified: Secondary | ICD-10-CM | POA: Diagnosis present

## 2020-07-11 MED ORDER — BENZONATATE 100 MG PO CAPS: 100.0000 mg | ORAL_CAPSULE | Freq: Three times a day (TID) | ORAL | 0 refills | Status: AC | PRN

## 2020-07-11 NOTE — ED Notes (Signed)
Pt ambulatory to room. Pt stating tested + for COVID 2wks ago but still has cough. Pt wanting something for cough.

## 2020-07-11 NOTE — ED Triage Notes (Signed)
Pt reports tested positive for COVID here 2 weeks ago and still has a cough so his nurse told him to come back to the ED and ask for another covid test

## 2020-07-11 NOTE — ED Provider Notes (Signed)
[facility] Emergency Department Provider Note  ____________________________________________  Time seen: Approximately 4:02 PM  I have reviewed the triage vital signs and the nursing notes.   HISTORY  Chief Complaint Cough    HPI Jesse Jimenez. is a 24 y.o. male that presents to the emergency department for evaluation of COVID-19 symptoms.  Patient was diagnosed with Covid 12 days ago.  He still has an intermittent dry cough that is improving.  He was recommended to come get another Covid test.  No fever, shortness of breath, chest pain, nausea, vomiting, abdominal pain, diarrhea.   Past Medical History:  Diagnosis Date  . BMI 31.0-31.9,adult     Patient Active Problem List   Diagnosis Date Noted  . BMI 31.0-31.9,adult     History reviewed. No pertinent surgical history.  Prior to Admission medications   Medication Sig Start Date End Date Taking? Authorizing Provider  benzonatate (TESSALON PERLES) 100 MG capsule Take 1 capsule (100 mg total) by mouth 3 (three) times daily as needed. 07/11/20 07/11/21  Enid Derry, PA-C  naproxen (NAPROSYN) 500 MG tablet Take 1 tablet (500 mg total) by mouth 2 (two) times daily with a meal. 11/30/19   Tommi Rumps, PA-C    Allergies Patient has no known allergies.  No family history on file.  Social History Social History   Tobacco Use  . Smoking status: Never Smoker  . Smokeless tobacco: Never Used  Substance Use Topics  . Alcohol use: Yes    Comment: Occ  . Drug use: No     Review of Systems  Constitutional: No fever/chills ENT: No upper respiratory complaints. Cardiovascular: No chest pain. Respiratory: Positive for intermittent dry cough. No SOB. Gastrointestinal: No abdominal pain.  No nausea, no vomiting.  Musculoskeletal: Negative for musculoskeletal pain. Skin: Negative for rash, abrasions, lacerations, ecchymosis. Neurological: Negative for headaches, numbness or  tingling   ____________________________________________   PHYSICAL EXAM:  VITAL SIGNS: ED Triage Vitals [07/11/20 1357]  Enc Vitals Group     BP      Pulse      Resp      Temp      Temp src      SpO2      Weight 200 lb (90.7 kg)     Height 5\' 7"  (1.702 m)     Head Circumference      Peak Flow      Pain Score 0     Pain Loc      Pain Edu?      Excl. in GC?      Constitutional: Alert and oriented. Well appearing and in no acute distress. Eyes: Conjunctivae are normal. PERRL. EOMI. Head: Atraumatic. ENT:      Ears:      Nose: No congestion/rhinnorhea.      Mouth/Throat: Mucous membranes are moist.  Neck: No stridor.  Cardiovascular: Normal rate, regular rhythm.  Good peripheral circulation. Respiratory: Normal respiratory effort without tachypnea or retractions. Lungs CTAB. Good air entry to the bases with no decreased or absent breath sounds. Musculoskeletal: Full range of motion to all extremities. No gross deformities appreciated. Neurologic:  Normal speech and language. No gross focal neurologic deficits are appreciated.  Skin:  Skin is warm, dry and intact. No rash noted. Psychiatric: Mood and affect are normal. Speech and behavior are normal. Patient exhibits appropriate insight and judgement.   ____________________________________________   LABS (all labs ordered are listed, but only abnormal results are displayed)  Labs Reviewed -  No data to display ____________________________________________  EKG   ____________________________________________  RADIOLOGY Lexine Baton, personally viewed and evaluated these images (plain radiographs) as part of my medical decision making, as well as reviewing the written report by the radiologist.  DG Chest 1 View  Result Date: 07/11/2020 CLINICAL DATA:  Cough, COVID EXAM: CHEST  1 VIEW COMPARISON:  06/29/2020 FINDINGS: The heart size and mediastinal contours are within normal limits. Both lungs are clear. The  visualized skeletal structures are unremarkable. IMPRESSION: No active disease. Electronically Signed   By: Jasmine Pang M.D.   On: 07/11/2020 16:45    ____________________________________________    PROCEDURES  Procedure(s) performed:    Procedures    Medications - No data to display   ____________________________________________   INITIAL IMPRESSION / ASSESSMENT AND PLAN / ED COURSE  Pertinent labs & imaging results that were available during my care of the patient were reviewed by me and considered in my medical decision making (see chart for details).  Review of the [place] CSRS was performed in accordance of the [facility] prior to dispensing any controlled drugs.   Patient presented to emergency department for recheck of COVID-19 symptoms.  Vital signs and exam are reassuring.  Chest x-ray negative for acute cardiopulmonary processes.  Patient denies any other symptoms other than an improving dry intermittent cough.  Patient will be discharged home with prescriptions for tessalon perles. Patient is to follow up with PCP as directed. Patient is given ED precautions to return to the ED for any worsening or new symptoms.   Jesse Jimenez. was evaluated in Emergency Department on 07/11/2020 for the symptoms described in the history of present illness. He was evaluated in the context of the global COVID-19 pandemic, which necessitated consideration that the patient might be at risk for infection with the SARS-CoV-2 virus that causes COVID-19. Institutional protocols and algorithms that pertain to the evaluation of patients at risk for COVID-19 are in a state of rapid change based on information released by regulatory bodies including the CDC and federal and state organizations. These policies and algorithms were followed during the patient's care in the ED.  ____________________________________________  FINAL CLINICAL IMPRESSION(S) / ED DIAGNOSES  Final diagnoses:  COVID-19       NEW MEDICATIONS STARTED DURING THIS VISIT:  ED Discharge Orders         Ordered    benzonatate (TESSALON PERLES) 100 MG capsule  3 times daily PRN        07/11/20 1747              This chart was dictated using voice recognition software/Dragon. Despite best efforts to proofread, errors can occur which can change the meaning. Any change was purely unintentional.    Enid Derry, PA-C 07/11/20 1827    Willy Eddy, MD 07/16/20 [phone]

## 2021-01-03 ENCOUNTER — Emergency Department
Admission: EM | Admit: 2021-01-03 | Discharge: 2021-01-03 | Disposition: A | Discharge status: Home / Self Care | Payer: Medicaid Other | Attending: Emergency Medicine | Admitting: Emergency Medicine

## 2021-01-03 ENCOUNTER — Other Ambulatory Visit: Payer: Self-pay

## 2021-01-03 DIAGNOSIS — A084 Viral intestinal infection, unspecified: Secondary | ICD-10-CM | POA: Insufficient documentation

## 2021-01-03 DIAGNOSIS — R197 Diarrhea, unspecified: Secondary | ICD-10-CM

## 2021-01-03 DIAGNOSIS — R112 Nausea with vomiting, unspecified: Secondary | ICD-10-CM

## 2021-01-03 LAB — CBC WITH DIFFERENTIAL/PLATELET
Abs Immature Granulocytes: 0.01 10*3/uL (ref 0.00–0.07)
Basophils Absolute: 0 10*3/uL (ref 0.0–0.1)
Basophils Relative: 0 %
Eosinophils Absolute: 0.3 10*3/uL (ref 0.0–0.5)
Eosinophils Relative: 4 %
HCT: 41.6 % (ref 39.0–52.0)
Hemoglobin: 14.1 g/dL (ref 13.0–17.0)
Immature Granulocytes: 0 %
Lymphocytes Relative: 32 %
Lymphs Abs: 2.4 10*3/uL (ref 0.7–4.0)
MCH: 28 pg (ref 26.0–34.0)
MCHC: 33.9 g/dL (ref 30.0–36.0)
MCV: 82.7 fL (ref 80.0–100.0)
Monocytes Absolute: 0.5 10*3/uL (ref 0.1–1.0)
Monocytes Relative: 6 %
Neutro Abs: 4.3 10*3/uL (ref 1.7–7.7)
Neutrophils Relative %: 58 %
Platelets: 159 10*3/uL (ref 150–400)
RBC: 5.03 MIL/uL (ref 4.22–5.81)
RDW: 13.2 % (ref 11.5–15.5)
WBC: 7.5 10*3/uL (ref 4.0–10.5)
nRBC: 0 % (ref 0.0–0.2)

## 2021-01-03 LAB — URINALYSIS, ROUTINE W REFLEX MICROSCOPIC
Bilirubin Urine: NEGATIVE
Glucose, UA: NEGATIVE mg/dL
Hgb urine dipstick: NEGATIVE
Ketones, ur: NEGATIVE mg/dL
Leukocytes,Ua: NEGATIVE
Nitrite: NEGATIVE
Protein, ur: NEGATIVE mg/dL
Specific Gravity, Urine: 1.025 (ref 1.005–1.030)
pH: 5 (ref 5.0–8.0)

## 2021-01-03 LAB — BASIC METABOLIC PANEL
Anion gap: 8 (ref 5–15)
BUN: 16 mg/dL (ref 6–20)
CO2: 25 mmol/L (ref 22–32)
Calcium: 9.1 mg/dL (ref 8.9–10.3)
Chloride: 104 mmol/L (ref 98–111)
Creatinine, Ser: 0.98 mg/dL (ref 0.61–1.24)
GFR, Estimated: >60 mL/min (ref >60)
Glucose, Bld: 87 mg/dL (ref 70–99)
Potassium: 3.9 mmol/L (ref 3.5–5.1)
Sodium: 137 mmol/L (ref 135–145)

## 2021-01-03 LAB — LIPASE, BLOOD: Lipase: 54 U/L — ABNORMAL HIGH (ref 11–51)

## 2021-01-03 MED ORDER — ONDANSETRON 4 MG PO TBDP
4.0000 mg | ORAL_TABLET | Freq: Once | ORAL | Status: AC
  Administered 2021-01-03: 4 mg via ORAL
  Filled 2021-01-03: qty 1

## 2021-01-03 MED ORDER — ONDANSETRON 4 MG PO TBDP: 4.0000 mg | ORAL_TABLET | Freq: Three times a day (TID) | ORAL | 0 refills | Status: AC | PRN

## 2021-01-03 NOTE — ED Triage Notes (Signed)
Pt states starting yesterday he has been having n/v/d. Pt states every time he eats or drinks something he vomits it up. Pt c/o generalized abd pain.

## 2021-01-03 NOTE — ED Provider Notes (Signed)
[facility] Emergency Department Provider Note ____________________________________________  Time seen: 2220  I have reviewed the triage vital signs and the nursing notes.  HISTORY  Chief Complaint  Emesis and Diarrhea  HPI Jesse Jimenez. is a 25 y.o. male presents himself to the ED for evaluation  of nausea, vomiting and diarrhea since yesterday.  Patient describes inability to keep anything down if he eats or drinks.  He denies any hematemesis, or bilious emesis.  He denies any bloody diarrhea or tarry stools.  He notes similar symptoms in his family members. Patient describes generalized abdominal pain.  He denies any recent fever, chills, sweats.  He also denies any  bad food exposure or recent travel.  Past Medical History:  Diagnosis Date  . BMI 31.0-31.9,adult     Patient Active Problem List   Diagnosis Date Noted  . BMI 31.0-31.9,adult     History reviewed. No pertinent surgical history.  Prior to Admission medications   Medication Sig Start Date End Date Taking? Authorizing Provider  ondansetron (ZOFRAN ODT) 4 MG disintegrating tablet Take 1 tablet (4 mg total) by mouth every 8 (eight) hours as needed. 01/03/21  Yes Bryan Goin, Charlesetta Ivory, PA-C  benzonatate (TESSALON PERLES) 100 MG capsule Take 1 capsule (100 mg total) by mouth 3 (three) times daily as needed. 07/11/20 07/11/21  Enid Derry, PA-C  naproxen (NAPROSYN) 500 MG tablet Take 1 tablet (500 mg total) by mouth 2 (two) times daily with a meal. 11/30/19   Tommi Rumps, PA-C    Allergies Patient has no known allergies.  No family history on file.  Social History Social History   Tobacco Use  . Smoking status: Never Smoker  . Smokeless tobacco: Never Used  Substance Use Topics  . Alcohol use: Yes    Comment: Occ  . Drug use: Yes    Types: Marijuana    Review of Systems  Constitutional: Negative for fever. Cardiovascular: Negative for chest pain. Respiratory:  Negative for shortness of breath. Gastrointestinal: Positive for abdominal pain, vomiting and diarrhea. Genitourinary: Negative for dysuria. Musculoskeletal: Negative for back pain. Skin: Negative for rash. Neurological: Negative for headaches, focal weakness or numbness. ____________________________________________  PHYSICAL EXAM:  VITAL SIGNS: ED Triage Vitals  Enc Vitals Group     BP 01/03/21 1911 124/80     Pulse Rate 01/03/21 1911 75     Resp 01/03/21 1911 16     Temp 01/03/21 1911 97.7 F (36.5 C)     Temp Source 01/03/21 1911 Oral     SpO2 01/03/21 1911 97 %     Weight 01/03/21 1911 205 lb (93 kg)     Height 01/03/21 1911 5\' 7"  (1.702 m)     Head Circumference --      Peak Flow --      Pain Score 01/03/21 1921 0     Pain Loc --      Pain Edu? --      Excl. in GC? --     Constitutional: Alert and oriented. Well appearing and in no distress. Head: Normocephalic and atraumatic. Eyes: Conjunctivae are normal. Normal extraocular movements Cardiovascular: Normal rate, regular rhythm. Normal distal pulses. Respiratory: Normal respiratory effort. No wheezes/rales/rhonchi. Gastrointestinal: Soft and nontender. No distention. Normal bowel sounds Musculoskeletal: Nontender with normal range of motion in all extremities.  Neurologic:  Normal gait without ataxia. Normal speech and language. No gross focal neurologic deficits are appreciated. Skin:  Skin is warm, dry and intact. No rash noted.  Psychiatric: Mood and affect are normal. Patient exhibits appropriate insight and judgment. ____________________________________________   LABS (pertinent positives/negatives) Labs Reviewed  URINALYSIS, ROUTINE W REFLEX MICROSCOPIC - Abnormal; Notable for the following components:      Result Value   Color, Urine YELLOW (*)    APPearance CLEAR (*)    All other components within normal limits  LIPASE, BLOOD - Abnormal; Notable for the following components:   Lipase 54 (*)    All other  components within normal limits  CBC WITH DIFFERENTIAL/PLATELET  BASIC METABOLIC PANEL  ___________________________________________  PROCEDURES  Zofran 4 gm ODT  PO challenge  Procedures ____________________________________________  INITIAL IMPRESSION / ASSESSMENT AND PLAN / ED COURSE  Differential diagnosis includes, but is not limited to, acute appendicitis, renal colic, testicular torsion, urinary tract infection/pyelonephritis, prostatitis,  epididymitis, diverticulitis, small bowel obstruction or ileus, colitis, abdominal aortic aneurysm, gastroenteritis, hernia, etc.  Patient with ED evaluation of N/V/D with similar symptoms in his household contacts. He is stable without signs of acute abdominal process, dehydration, or toxic appearance. He notes no current pain or other symptoms. He will discharge with diagnosis of likely VGE and with a  Prescription for Zofran. A work note is provided.   [name]. was evaluated in Emergency Department on 01/03/2021 for the symptoms described in the history of present illness. He was evaluated in the context of the global COVID-19 pandemic, which necessitated consideration that the patient might be at risk for infection with the SARS-CoV-2 virus that causes COVID-19. Institutional protocols and algorithms that pertain to the evaluation of patients at risk for COVID-19 are in a state of rapid change based on information released by regulatory bodies including the CDC and federal and state organizations. These policies and algorithms were followed during the patient's care in the ED. ____________________________________________  FINAL CLINICAL IMPRESSION(S) / ED DIAGNOSES  Final diagnoses:  Nausea vomiting and diarrhea  Viral gastroenteritis      Karmen Stabs, Charlesetta Ivory, PA-C 01/03/21 2316    Sharman Cheek, MD 01/04/21 0006

## 2021-01-03 NOTE — Discharge Instructions (Addendum)
Your exam and labs are normal at this time.  You likely have a had symptoms due to a viral GI infection.  Continue to push fluids and use the nausea medicine as needed.  Follow-up with your provider within the urgent care as needed.

## 2021-12-29 IMAGING — CR DG SHOULDER 2+V*L*
3 series · 3 of 3 positions shown · non-contrast
Comparison: None.

CLINICAL DATA: Left shoulder pain

EXAM:
LEFT SHOULDER - 2+ VIEW

[shoulder grashey]
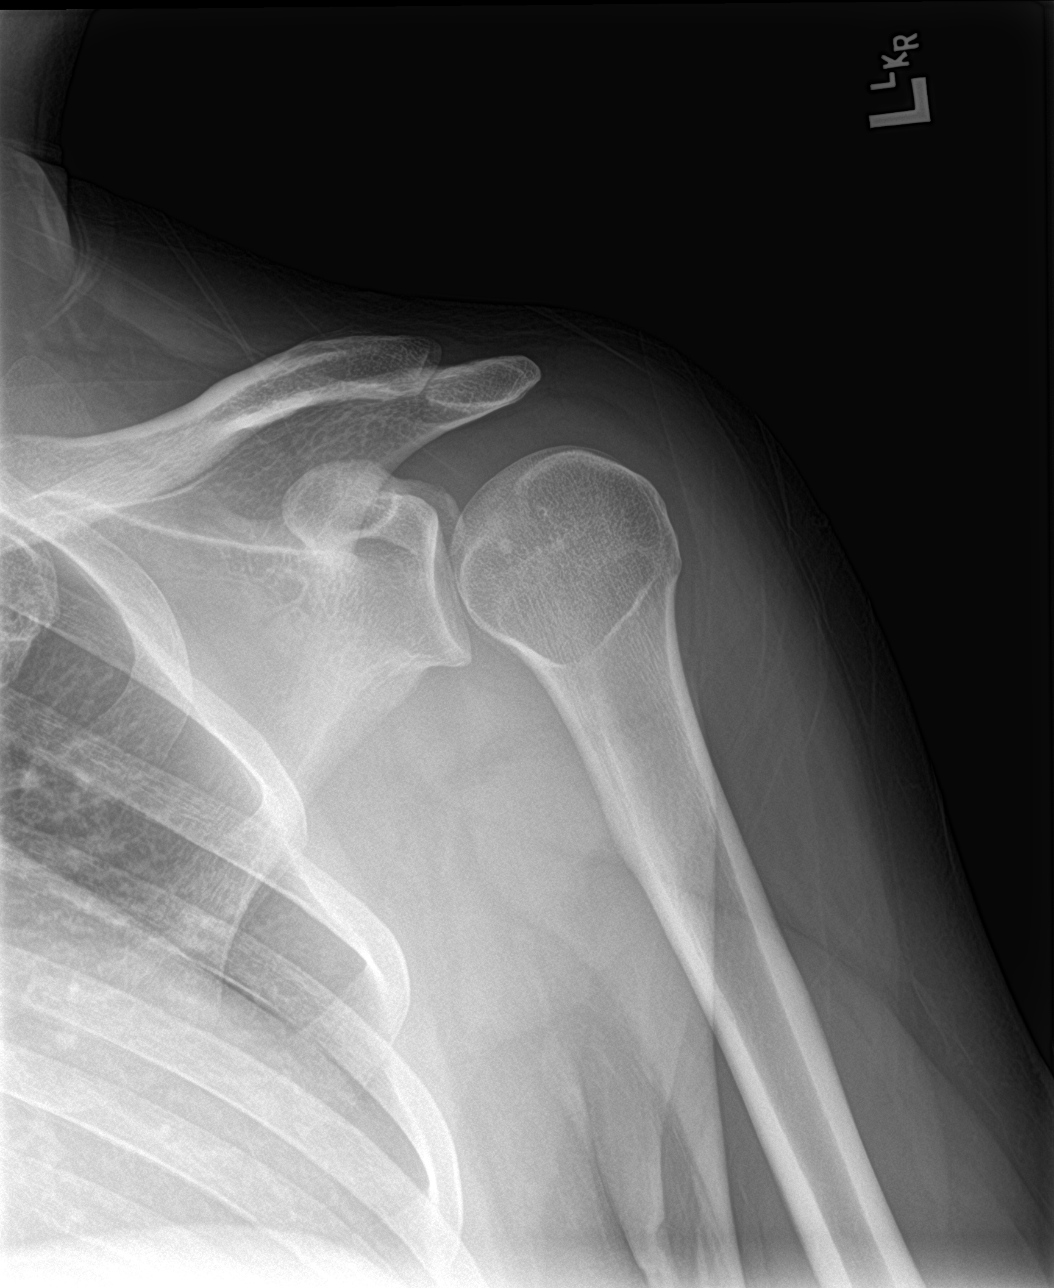

[shoulder y view]
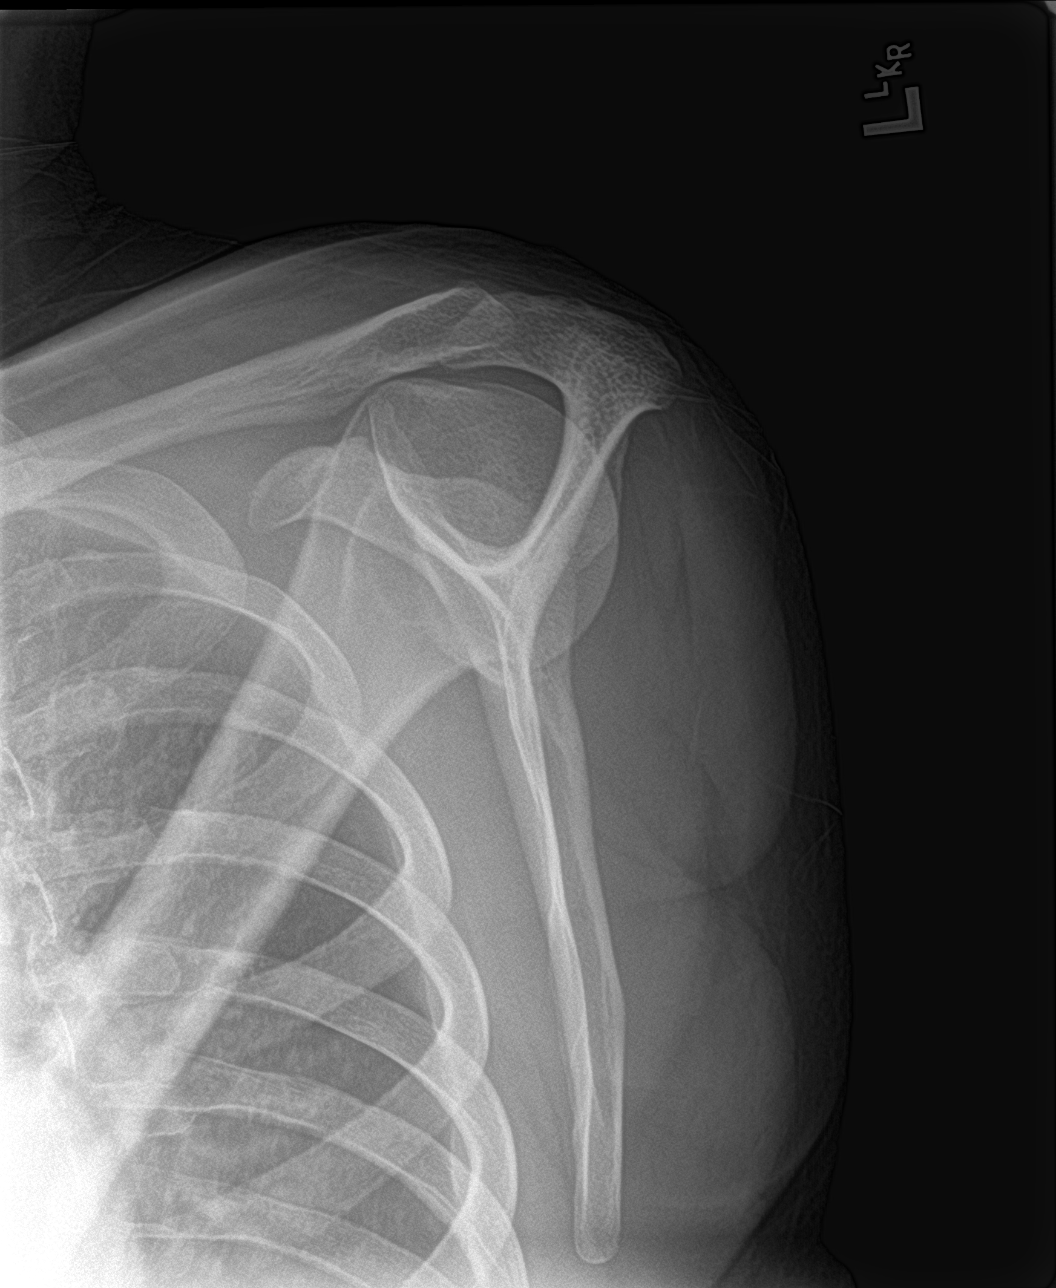

[shoulder axillary]
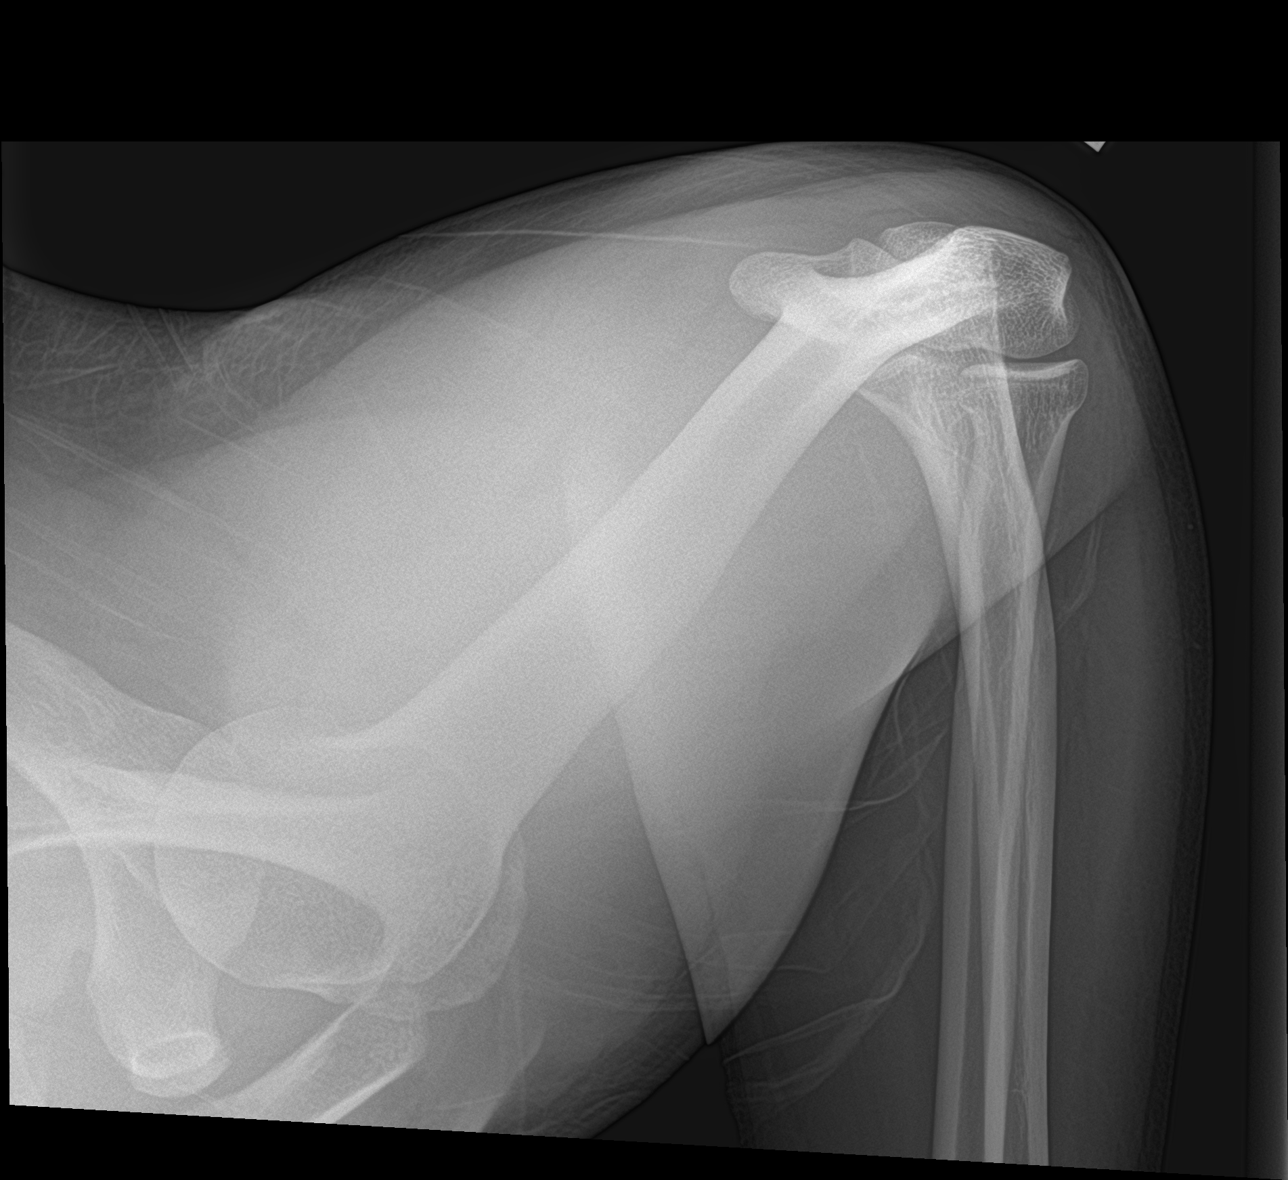

[3 of 3 positions shown; findings below may reference images not displayed]

FINDINGS: There is no evidence of fracture or dislocation. There is no
evidence of arthropathy or other focal bone abnormality. Soft
tissues are unremarkable.
IMPRESSION: Negative.

## 2022-07-29 IMAGING — DX DG CHEST 1V PORT
1 series · 1 of 1 positions shown · non-contrast
Comparison: Chest radiograph dated 10/18/2019.

CLINICAL DATA: 24-year-old male with cough.

EXAM:
PORTABLE CHEST 1 VIEW

[chest ap]
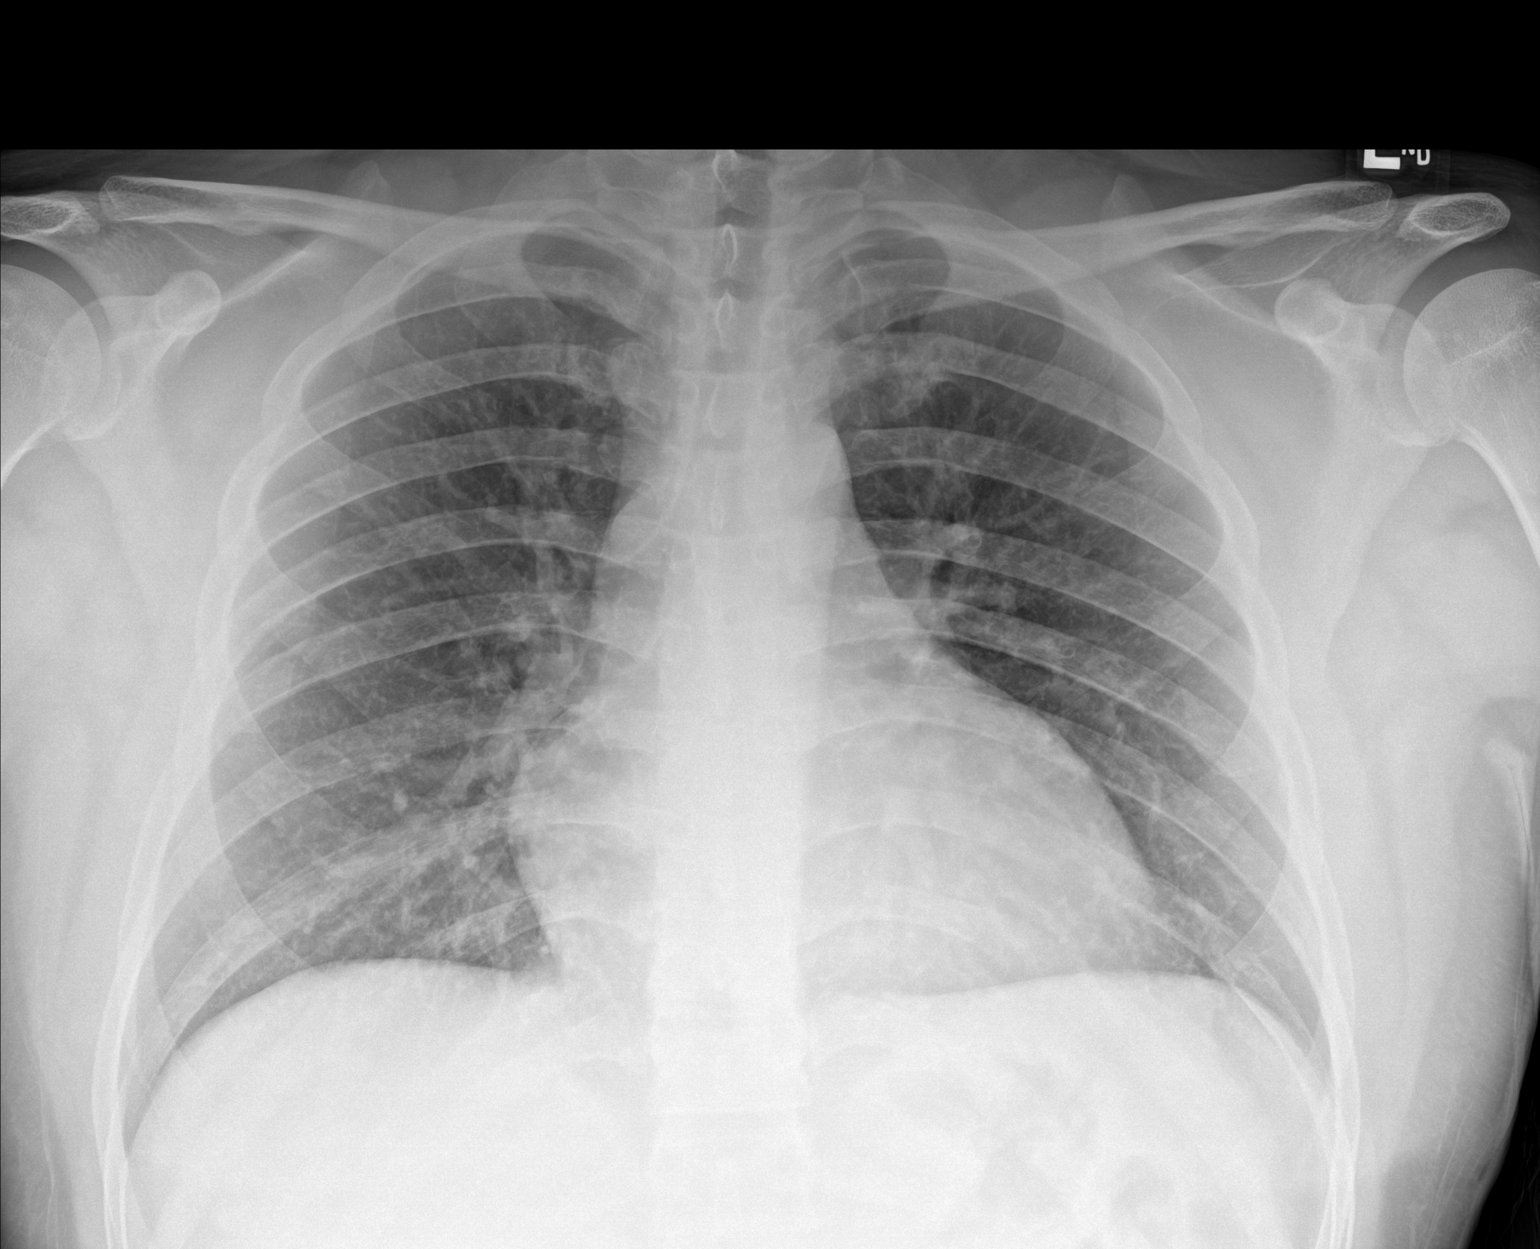

[1 of 1 positions shown; findings below may reference images not displayed]

FINDINGS: The heart size and mediastinal contours are within normal limits.
Both lungs are clear. The visualized skeletal structures are
unremarkable.
IMPRESSION: No active disease.

## 2023-10-02 ENCOUNTER — Emergency Department
Admission: EM | Admit: 2023-10-02 | Discharge: 2023-10-02 | Disposition: A | Discharge status: Home / Self Care | Payer: Medicaid Other | Attending: Emergency Medicine | Admitting: Emergency Medicine

## 2023-10-02 ENCOUNTER — Other Ambulatory Visit: Payer: Self-pay

## 2023-10-02 DIAGNOSIS — M545 Low back pain, unspecified: Secondary | ICD-10-CM | POA: Diagnosis present

## 2023-10-02 DIAGNOSIS — M5431 Sciatica, right side: Secondary | ICD-10-CM | POA: Insufficient documentation

## 2023-10-02 MED ORDER — DEXAMETHASONE SODIUM PHOSPHATE 10 MG/ML IJ SOLN
10.0000 mg | Freq: Once | INTRAMUSCULAR | Status: AC
  Administered 2023-10-02: 10 mg via INTRAMUSCULAR
  Filled 2023-10-02: qty 1

## 2023-10-02 MED ORDER — METHOCARBAMOL 750 MG PO TABS: 750.0000 mg | ORAL_TABLET | Freq: Three times a day (TID) | ORAL | 0 refills | Status: AC

## 2023-10-02 NOTE — ED Provider Notes (Signed)
 [facility] Provider Note    Event Date/Time   First MD Initiated Contact with Patient 10/02/23 1315     (approximate)   History   Back Pain   HPI  Jesse Jimenez. is a 28 y.o. male who presents for evaluation of lower back pain that radiates down the right leg.  Patient states that this pain has been something else on bothering him since he was 15.  It occurs intermittently.  His pain this time started yesterday morning.  Prior to the onset of his pain there was no specific injury or trauma.  Denies changes in bladder and bowel function.  He states that he has been evaluated for this before and has had an MRI which showed herniated disc.  He was recommended to have surgery but decided not to at the time.  He would like to know today what the process is to get surgery.     Physical Exam   Triage Vital Signs: ED Triage Vitals  Encounter Vitals Group     BP 10/02/23 1200 135/82     Systolic BP Percentile --      Diastolic BP Percentile --      Pulse Rate 10/02/23 1200 87     Resp 10/02/23 1200 18     Temp 10/02/23 1200 97.6 F (36.4 C)     Temp Source 10/02/23 1200 Oral     SpO2 10/02/23 1200 100 %     Weight 10/02/23 1159 215 lb (97.5 kg)     Height 10/02/23 1159 5' 7 (1.702 m)     Head Circumference --      Peak Flow --      Pain Score 10/02/23 1158 8     Pain Loc --      Pain Education --      Exclude from Growth Chart --     Most recent vital signs: Vitals:   10/02/23 1200  BP: 135/82  Pulse: 87  Resp: 18  Temp: 97.6 F (36.4 C)  SpO2: 100%   General: Awake, no distress.  CV:  Good peripheral perfusion.  RRR. Resp:  Normal effort.  CTAB. Abd:  No distention.  Other:  Positive straight leg raise on the right side.  Sensation intact across all dermatomes of the right leg.  Dorsalis pedis pulse and posterior tibialis pulse are 2+ and regular.  5/5 strength in the right lower extremity.  ED Results / Procedures / Treatments    Labs (all labs ordered are listed, but only abnormal results are displayed) Labs Reviewed - No data to display    PROCEDURES:  Critical Care performed: No  Procedures   MEDICATIONS ORDERED IN ED: Medications  dexamethasone  (DECADRON ) injection 10 mg (has no administration in time range)     IMPRESSION / MDM / ASSESSMENT AND PLAN / ED COURSE  I reviewed the triage vital signs and the nursing notes.                             28 year old male presents for evaluation of lower back pain.  Vital signs are stable, patient NAD on exam.  Differential diagnosis includes, but is not limited to, sciatica, muscle strain, ligamentous injury, compression fracture.  Patient's presentation is most consistent with acute, uncomplicated illness.  Patient's physical exam is consistent with sciatica.  He states he has a known history of this and has had an MRI before that showed  herniated disc.  He expressed interest in getting surgery for this.  I explained that this is not something that would be done directly from the ER today but that I would be happy to get him a referral to neurosurgery so they can discuss this further.  In the meantime, we discussed getting his pain under control.  He was given a dose of dexamethasone  while in the ED today.  I advised him to take ibuprofen and Tylenol outpatient.  He can continue to use the massage gun and IcyHot patches.  He is welcome to wear the back brace he brought in with him today for provide some additional comfort.  We discussed taking a break from playing soccer and did not doing heavy lifting at work.  He was provided a note to say so.  He stated that he does not have a primary care provider so I will place a referral for one as well as give him some offices to reach out to.  He voiced understanding, all questions were answered and he was stable at discharge.     FINAL CLINICAL IMPRESSION(S) / ED DIAGNOSES   Final diagnoses:  Sciatica of right  side     Rx / DC Orders   ED Discharge Orders          Ordered    methocarbamol  (ROBAXIN -750) 750 MG tablet  3 times daily        10/02/23 1358             Note:  This document was prepared using Dragon voice recognition software and may include unintentional dictation errors.   Cleaster Tinnie LABOR, PA-C 10/02/23 1400    Viviann Pastor, MD 10/02/23 1446

## 2023-10-02 NOTE — ED Triage Notes (Signed)
 Pt comes with back pain that started yesterday. Pt states some heavy lifting at work. Pt denies any recent injuries. Pt states lower back pain that radiates down right leg.

## 2023-10-02 NOTE — Discharge Instructions (Addendum)
 Your pain is coming from your sciatic nerve today.    This is best treated with a multimodal approach.  Please take 600 mg of ibuprofen every 6 hours.  This is an anti-inflammatory that is typically the most effective medication for this type of pain.  You can have 650 mg of Tylenol every 6 hours as needed for additional pain control.  You may continue to use the massage gun and the icy hot patches.  You can also try ice packs and a heating pad.  Furthermore, I have sent a muscle relaxer to the pharmacy.  This can be taken 3 times daily as needed for muscle spasms.  Keep in mind that this medication is sedating so you cannot drive or work after taking it.    You will need to take a break from playing soccer and I do not recommend playing in the game on Monday night.  Please reach out to Dr. Theressa office to schedule an appointment.  He is a midwife.  This is who you would talk to about having back surgery.  Please return to the ED with any new or worsening symptoms.

## 2023-10-07 ENCOUNTER — Telehealth: Payer: Self-pay | Admitting: Neurosurgery

## 2023-10-07 NOTE — Telephone Encounter (Signed)
"   nonurgent referall from ED. OK for APP clinic. no images yet. "   Message from Dr.Smith - left patient 3 voicemail's
